# Patient Record
Sex: Female | Born: 1970
Health system: Southern US, Community
[De-identification: ages and names within clinical notes are randomized; demographics above are authoritative.]

## PROBLEM LIST (undated history)

## (undated) DIAGNOSIS — IMO0001 Reserved for inherently not codable concepts without codable children: Secondary | ICD-10-CM

## (undated) DIAGNOSIS — M1990 Unspecified osteoarthritis, unspecified site: Secondary | ICD-10-CM

## (undated) DIAGNOSIS — K219 Gastro-esophageal reflux disease without esophagitis: Secondary | ICD-10-CM

---

## 2010-07-02 ENCOUNTER — Ambulatory Visit: Payer: No Typology Code available for payment source | Attending: Family Medicine

## 2010-07-02 DIAGNOSIS — M542 Cervicalgia: Secondary | ICD-10-CM | POA: Insufficient documentation

## 2010-07-02 DIAGNOSIS — M545 Low back pain, unspecified: Secondary | ICD-10-CM | POA: Insufficient documentation

## 2010-07-02 DIAGNOSIS — R5381 Other malaise: Secondary | ICD-10-CM | POA: Insufficient documentation

## 2010-07-02 DIAGNOSIS — IMO0001 Reserved for inherently not codable concepts without codable children: Secondary | ICD-10-CM | POA: Insufficient documentation

## 2010-07-08 ENCOUNTER — Ambulatory Visit: Payer: BC Managed Care – PPO | Attending: Family Medicine

## 2010-07-08 DIAGNOSIS — M542 Cervicalgia: Secondary | ICD-10-CM | POA: Insufficient documentation

## 2010-07-08 DIAGNOSIS — M545 Low back pain, unspecified: Secondary | ICD-10-CM | POA: Insufficient documentation

## 2010-07-08 DIAGNOSIS — IMO0001 Reserved for inherently not codable concepts without codable children: Secondary | ICD-10-CM | POA: Insufficient documentation

## 2010-07-08 DIAGNOSIS — R5381 Other malaise: Secondary | ICD-10-CM | POA: Insufficient documentation

## 2010-07-09 ENCOUNTER — Ambulatory Visit: Payer: BC Managed Care – PPO

## 2010-07-14 ENCOUNTER — Ambulatory Visit: Payer: BC Managed Care – PPO

## 2010-07-16 ENCOUNTER — Ambulatory Visit: Payer: BC Managed Care – PPO

## 2010-07-21 ENCOUNTER — Ambulatory Visit: Payer: BC Managed Care – PPO

## 2010-07-23 ENCOUNTER — Ambulatory Visit: Payer: BC Managed Care – PPO

## 2010-07-28 ENCOUNTER — Ambulatory Visit: Payer: BC Managed Care – PPO

## 2010-07-30 ENCOUNTER — Ambulatory Visit: Payer: BC Managed Care – PPO

## 2010-08-04 ENCOUNTER — Ambulatory Visit: Payer: No Typology Code available for payment source | Attending: Family Medicine

## 2010-08-04 DIAGNOSIS — M542 Cervicalgia: Secondary | ICD-10-CM | POA: Insufficient documentation

## 2010-08-04 DIAGNOSIS — M545 Low back pain, unspecified: Secondary | ICD-10-CM | POA: Insufficient documentation

## 2010-08-04 DIAGNOSIS — IMO0001 Reserved for inherently not codable concepts without codable children: Secondary | ICD-10-CM | POA: Insufficient documentation

## 2010-08-04 DIAGNOSIS — R5381 Other malaise: Secondary | ICD-10-CM | POA: Insufficient documentation

## 2010-08-06 ENCOUNTER — Ambulatory Visit: Payer: No Typology Code available for payment source

## 2010-08-11 ENCOUNTER — Ambulatory Visit: Payer: No Typology Code available for payment source

## 2010-08-13 ENCOUNTER — Ambulatory Visit: Payer: No Typology Code available for payment source

## 2010-08-20 ENCOUNTER — Other Ambulatory Visit: Payer: Self-pay | Admitting: Sports Medicine

## 2010-08-20 ENCOUNTER — Ambulatory Visit
Admission: RE | Admit: 2010-08-20 | Discharge: 2010-08-20 | Disposition: A | Discharge status: Home / Self Care | Payer: No Typology Code available for payment source | Source: Ambulatory Visit | Attending: Sports Medicine | Admitting: Sports Medicine

## 2010-08-20 DIAGNOSIS — M25512 Pain in left shoulder: Secondary | ICD-10-CM

## 2010-12-23 ENCOUNTER — Other Ambulatory Visit (HOSPITAL_COMMUNITY)
Admission: RE | Admit: 2010-12-23 | Discharge: 2010-12-23 | Disposition: A | Discharge status: Home / Self Care | Payer: BC Managed Care – PPO | Source: Ambulatory Visit | Attending: Family Medicine | Admitting: Family Medicine

## 2010-12-23 DIAGNOSIS — Z124 Encounter for screening for malignant neoplasm of cervix: Secondary | ICD-10-CM | POA: Insufficient documentation

## 2010-12-23 DIAGNOSIS — Z1159 Encounter for screening for other viral diseases: Secondary | ICD-10-CM | POA: Insufficient documentation

## 2012-01-27 ENCOUNTER — Ambulatory Visit: Payer: BC Managed Care – PPO

## 2012-01-27 ENCOUNTER — Other Ambulatory Visit: Payer: Self-pay | Admitting: Sports Medicine

## 2012-01-27 DIAGNOSIS — M79642 Pain in left hand: Secondary | ICD-10-CM

## 2012-03-02 ENCOUNTER — Other Ambulatory Visit: Payer: Self-pay | Admitting: Sports Medicine

## 2012-03-02 ENCOUNTER — Ambulatory Visit (INDEPENDENT_AMBULATORY_CARE_PROVIDER_SITE_OTHER): Payer: BC Managed Care – PPO

## 2012-03-02 DIAGNOSIS — M79609 Pain in unspecified limb: Secondary | ICD-10-CM

## 2012-03-02 DIAGNOSIS — R52 Pain, unspecified: Secondary | ICD-10-CM

## 2012-03-30 ENCOUNTER — Other Ambulatory Visit: Payer: Self-pay | Admitting: Sports Medicine

## 2012-03-30 ENCOUNTER — Ambulatory Visit (INDEPENDENT_AMBULATORY_CARE_PROVIDER_SITE_OTHER): Payer: BC Managed Care – PPO

## 2012-03-30 DIAGNOSIS — R52 Pain, unspecified: Secondary | ICD-10-CM

## 2012-03-30 DIAGNOSIS — M79609 Pain in unspecified limb: Secondary | ICD-10-CM

## 2012-08-30 ENCOUNTER — Other Ambulatory Visit (HOSPITAL_COMMUNITY): Payer: Self-pay | Admitting: Family Medicine

## 2012-08-30 DIAGNOSIS — Z1231 Encounter for screening mammogram for malignant neoplasm of breast: Secondary | ICD-10-CM

## 2012-09-07 ENCOUNTER — Other Ambulatory Visit: Payer: Self-pay | Admitting: Family Medicine

## 2012-09-07 ENCOUNTER — Ambulatory Visit (HOSPITAL_COMMUNITY)
Admission: RE | Admit: 2012-09-07 | Discharge: 2012-09-07 | Disposition: A | Discharge status: Home / Self Care | Payer: BC Managed Care – PPO | Source: Ambulatory Visit | Attending: Family Medicine | Admitting: Family Medicine

## 2012-09-07 DIAGNOSIS — Z1231 Encounter for screening mammogram for malignant neoplasm of breast: Secondary | ICD-10-CM

## 2012-09-07 DIAGNOSIS — N63 Unspecified lump in unspecified breast: Secondary | ICD-10-CM

## 2012-09-28 ENCOUNTER — Ambulatory Visit
Admission: RE | Admit: 2012-09-28 | Discharge: 2012-09-28 | Disposition: A | Discharge status: Home / Self Care | Payer: BC Managed Care – PPO | Source: Ambulatory Visit | Attending: Family Medicine | Admitting: Family Medicine

## 2012-09-28 DIAGNOSIS — N63 Unspecified lump in unspecified breast: Secondary | ICD-10-CM

## 2013-01-27 ENCOUNTER — Other Ambulatory Visit (HOSPITAL_COMMUNITY)
Admission: RE | Admit: 2013-01-27 | Discharge: 2013-01-27 | Disposition: A | Discharge status: Home / Self Care | Payer: BC Managed Care – PPO | Source: Ambulatory Visit | Attending: Family Medicine | Admitting: Family Medicine

## 2013-01-27 ENCOUNTER — Other Ambulatory Visit: Payer: Self-pay | Admitting: Family Medicine

## 2013-01-27 DIAGNOSIS — Z124 Encounter for screening for malignant neoplasm of cervix: Secondary | ICD-10-CM | POA: Insufficient documentation

## 2013-01-28 ENCOUNTER — Encounter (HOSPITAL_COMMUNITY): Payer: Self-pay | Admitting: *Deleted

## 2013-01-28 ENCOUNTER — Emergency Department (HOSPITAL_COMMUNITY)
Admission: EM | Admit: 2013-01-28 | Discharge: 2013-01-28 | Disposition: A | Discharge status: Home / Self Care | Payer: BC Managed Care – PPO | Attending: Emergency Medicine | Admitting: Emergency Medicine

## 2013-01-28 ENCOUNTER — Emergency Department (HOSPITAL_COMMUNITY): Payer: BC Managed Care – PPO

## 2013-01-28 DIAGNOSIS — Z792 Long term (current) use of antibiotics: Secondary | ICD-10-CM | POA: Insufficient documentation

## 2013-01-28 DIAGNOSIS — S0003XA Contusion of scalp, initial encounter: Secondary | ICD-10-CM | POA: Insufficient documentation

## 2013-01-28 DIAGNOSIS — K219 Gastro-esophageal reflux disease without esophagitis: Secondary | ICD-10-CM | POA: Insufficient documentation

## 2013-01-28 DIAGNOSIS — S0083XA Contusion of other part of head, initial encounter: Secondary | ICD-10-CM

## 2013-01-28 DIAGNOSIS — Z79899 Other long term (current) drug therapy: Secondary | ICD-10-CM | POA: Insufficient documentation

## 2013-01-28 HISTORY — DX: Gastro-esophageal reflux disease without esophagitis: K21.9

## 2013-01-28 HISTORY — DX: Reserved for inherently not codable concepts without codable children: IMO0001
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
## 2013-01-30 ENCOUNTER — Other Ambulatory Visit: Payer: Self-pay | Admitting: Family Medicine

## 2013-01-30 DIAGNOSIS — R748 Abnormal levels of other serum enzymes: Secondary | ICD-10-CM

## 2013-02-03 ENCOUNTER — Ambulatory Visit
Admission: RE | Admit: 2013-02-03 | Discharge: 2013-02-03 | Disposition: A | Discharge status: Home / Self Care | Payer: BC Managed Care – PPO | Source: Ambulatory Visit | Attending: Family Medicine | Admitting: Family Medicine

## 2013-02-03 DIAGNOSIS — R748 Abnormal levels of other serum enzymes: Secondary | ICD-10-CM

## 2013-03-06 ENCOUNTER — Encounter (HOSPITAL_COMMUNITY): Payer: Self-pay | Admitting: Emergency Medicine

## 2013-03-06 ENCOUNTER — Emergency Department (HOSPITAL_COMMUNITY): Payer: BC Managed Care – PPO

## 2013-03-06 ENCOUNTER — Emergency Department (HOSPITAL_COMMUNITY)
Admission: EM | Admit: 2013-03-06 | Discharge: 2013-03-06 | Disposition: A | Discharge status: Home / Self Care | Payer: BC Managed Care – PPO | Attending: Emergency Medicine | Admitting: Emergency Medicine

## 2013-03-06 DIAGNOSIS — S4980XA Other specified injuries of shoulder and upper arm, unspecified arm, initial encounter: Secondary | ICD-10-CM | POA: Insufficient documentation

## 2013-03-06 DIAGNOSIS — S59909A Unspecified injury of unspecified elbow, initial encounter: Secondary | ICD-10-CM | POA: Insufficient documentation

## 2013-03-06 DIAGNOSIS — S0993XA Unspecified injury of face, initial encounter: Secondary | ICD-10-CM | POA: Insufficient documentation

## 2013-03-06 DIAGNOSIS — S79919A Unspecified injury of unspecified hip, initial encounter: Secondary | ICD-10-CM | POA: Insufficient documentation

## 2013-03-06 DIAGNOSIS — M2550 Pain in unspecified joint: Secondary | ICD-10-CM

## 2013-03-06 DIAGNOSIS — S79929A Unspecified injury of unspecified thigh, initial encounter: Secondary | ICD-10-CM | POA: Insufficient documentation

## 2013-03-06 DIAGNOSIS — S6990XA Unspecified injury of unspecified wrist, hand and finger(s), initial encounter: Secondary | ICD-10-CM | POA: Insufficient documentation

## 2013-03-06 DIAGNOSIS — S46909A Unspecified injury of unspecified muscle, fascia and tendon at shoulder and upper arm level, unspecified arm, initial encounter: Secondary | ICD-10-CM | POA: Insufficient documentation

## 2013-03-06 DIAGNOSIS — K219 Gastro-esophageal reflux disease without esophagitis: Secondary | ICD-10-CM | POA: Insufficient documentation

## 2013-03-06 HISTORY — DX: Unspecified osteoarthritis, unspecified site: M19.90

## 2013-03-06 MED ORDER — KETOROLAC TROMETHAMINE 60 MG/2ML IM SOLN
60.0000 mg | Freq: Once | INTRAMUSCULAR | Status: AC
  Administered 2013-03-06: 60 mg via INTRAMUSCULAR
  Filled 2013-03-06: qty 2

## 2013-03-06 MED ORDER — MELOXICAM 7.5 MG PO TABS: 15.0000 mg | ORAL_TABLET | Freq: Every day | ORAL | Status: DC
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
## 2013-03-07 NOTE — ED Provider Notes (Signed)
Medical screening examination/treatment/procedure(s) were performed by non-physician practitioner and as supervising physician I was immediately available for consultation/collaboration.  Toy Baker, MD 03/07/13 2325

## 2013-06-12 ENCOUNTER — Emergency Department (HOSPITAL_COMMUNITY): Payer: BC Managed Care – PPO

## 2013-06-12 ENCOUNTER — Emergency Department (HOSPITAL_COMMUNITY)
Admission: EM | Admit: 2013-06-12 | Discharge: 2013-06-13 | Disposition: A | Discharge status: Home / Self Care | Payer: BC Managed Care – PPO | Attending: Emergency Medicine | Admitting: Emergency Medicine

## 2013-06-12 ENCOUNTER — Encounter (HOSPITAL_COMMUNITY): Payer: Self-pay | Admitting: Emergency Medicine

## 2013-06-12 DIAGNOSIS — R0789 Other chest pain: Secondary | ICD-10-CM | POA: Insufficient documentation

## 2013-06-12 DIAGNOSIS — Z79899 Other long term (current) drug therapy: Secondary | ICD-10-CM | POA: Insufficient documentation

## 2013-06-12 DIAGNOSIS — K219 Gastro-esophageal reflux disease without esophagitis: Secondary | ICD-10-CM | POA: Insufficient documentation

## 2013-06-12 DIAGNOSIS — M129 Arthropathy, unspecified: Secondary | ICD-10-CM | POA: Insufficient documentation

## 2013-06-12 DIAGNOSIS — R079 Chest pain, unspecified: Secondary | ICD-10-CM

## 2013-06-12 LAB — POCT I-STAT TROPONIN I: TROPONIN I, POC: 0.01 ng/mL (ref 0.00–0.08)

## 2013-06-12 LAB — BASIC METABOLIC PANEL
BUN: 11 mg/dL (ref 6–23)
CALCIUM: 9.1 mg/dL (ref 8.4–10.5)
CHLORIDE: 100 meq/L (ref 96–112)
CO2: 27 meq/L (ref 19–32)
Creatinine, Ser: 0.68 mg/dL (ref 0.50–1.10)
GFR calc Af Amer: >90 mL/min (ref >90)
GFR calc non Af Amer: >90 mL/min (ref >90)
Glucose, Bld: 111 mg/dL — ABNORMAL HIGH (ref 70–99)
POTASSIUM: 4.2 meq/L (ref 3.7–5.3)
SODIUM: 138 meq/L (ref 137–147)

## 2013-06-12 LAB — CBC
HCT: 36.8 % (ref 36.0–46.0)
HEMOGLOBIN: 12.2 g/dL (ref 12.0–15.0)
MCH: 27.4 pg (ref 26.0–34.0)
MCHC: 33.2 g/dL (ref 30.0–36.0)
MCV: 82.5 fL (ref 78.0–100.0)
PLATELETS: 281 10*3/uL (ref 150–400)
RBC: 4.46 MIL/uL (ref 3.87–5.11)
RDW: 13.4 % (ref 11.5–15.5)
WBC: 11.6 10*3/uL — AB (ref 4.0–10.5)

## 2013-06-12 LAB — D-DIMER, QUANTITATIVE (NOT AT ARMC): D-Dimer, Quant: 0.39 ug/mL-FEU (ref 0.00–0.48)

## 2013-06-12 LAB — TROPONIN I: Troponin I: <0.3 ng/mL (ref <0.30)

## 2013-06-12 MED ORDER — IBUPROFEN 800 MG PO TABS
800.0000 mg | ORAL_TABLET | Freq: Once | ORAL | Status: AC
  Administered 2013-06-12: 800 mg via ORAL
  Filled 2013-06-12: qty 1

## 2013-06-12 NOTE — ED Notes (Signed)
Pt states that she began to have central chest pain described as burning that began around 2pm; pt states that she has had intermittent pain to her rt shoulder and left shoulder and some to her back; pt denies shortness of breath or nausea; pt c/o mild dizziness.

## 2013-06-12 NOTE — ED Provider Notes (Signed)
CSN: [id number]     Arrival date & time 06/12/13  1858 History   First MD Initiated Contact with Patient 06/12/13 2048     Chief Complaint  Patient presents with  . Chest Pain     (Consider location/radiation/quality/duration/timing/severity/associated sxs/prior Treatment) HPI Pt presenting with c/o central burning chest pain which started approx 2pm today. Pain has been constant since that time.  She was in a meeting when pain began.  No cough, no nausea or diaphoresis.  Pain was also in right shoulder.  No abdominal pain or vomiting.  No fever/chills, no leg swelling. No hx of recent trauma or surgery.  Did have a flight to [place] < 2 hours last week.  Chest pain began at rest and described as burning.  No hx of similar symptoms.  There are no other associated systemic symptoms, there are no other alleviating or modifying factors.   Past Medical History  Diagnosis Date  . Reflux   . Joint inflammation    History reviewed. No pertinent past surgical history. No family history on file. History  Substance Use Topics  . Smoking status: Never Smoker   . Smokeless tobacco: Not on file  . Alcohol Use: Yes     Comment: social   OB History   Grav Para Term Preterm Abortions TAB SAB Ect Mult Living                 Review of Systems ROS reviewed and all otherwise negative except for mentioned in HPI    Allergies  Flexeril  Home Medications   Current Outpatient Rx  Name  Route  Sig  Dispense  Refill  . esomeprazole (NEXIUM) 40 MG capsule   Oral   Take 40 mg by mouth every evening.         Marland Kitchen ibuprofen (ADVIL,MOTRIN) 200 MG tablet   Oral   Take 200 mg by mouth every 6 (six) hours as needed for mild pain.         . Multiple Vitamin (MULTIVITAMIN WITH MINERALS) TABS tablet   Oral   Take 1 tablet by mouth every morning.          BP 135/80  Pulse 86  Temp(Src) 98.8 F (37.1 C) (Oral)  Resp 18  SpO2 100%  LMP 06/05/2013 Vitals reviewed Physical Exam Physical  Examination: General appearance - alert, well appearing, and in no distress Mental status - alert, oriented to person, place, and time Eyes - no conjunctival injection, no scleral icterus Mouth - mucous membranes moist, pharynx normal without lesions Chest - clear to auscultation, no wheezes, rales or rhonchi, symmetric air entry Heart - normal rate, regular rhythm, normal S1, S2, no murmurs, rubs, clicks or gallops Abdomen - soft, nontender, nondistended, no masses or organomegaly Extremities - peripheral pulses normal, no pedal edema, no clubbing or cyanosis Skin - normal coloration and turgor, no rashes  ED Course  Procedures (including critical care time) Labs Review Labs Reviewed  CBC - Abnormal; Notable for the following:    WBC 11.6 (*)    All other components within normal limits  BASIC METABOLIC PANEL - Abnormal; Notable for the following:    Glucose, Bld 111 (*)    All other components within normal limits  TROPONIN I  D-DIMER, QUANTITATIVE  POCT I-STAT TROPONIN I   Imaging Review Dg Chest 2 View  06/12/2013   CLINICAL DATA:  Chest pain, cough  EXAM: CHEST  2 VIEW  COMPARISON:  None.  FINDINGS: The heart  size and mediastinal contours are within normal limits. Both lungs are clear. The visualized skeletal structures are unremarkable.  IMPRESSION: No active cardiopulmonary disease.   Electronically Signed   By: Salome HolmesHector  Cooper M.D.   On: 06/12/2013 20:42    EKG Interpretation    Date/Time:  Monday June 12 2013 19:10:59 EST Ventricular Rate:  60 PR Interval:  154 QRS Duration: 74 QT Interval:  408 QTC Calculation: 408 R Axis:   21 Text Interpretation:  Sinus rhythm nonspecific st changes No old tracing to compare Confirmed by [facility] Center[facility]  MD, MARTHA [phone]([id number]) on 06/13/2013 5:09:07 PM            MDM   Final diagnoses:  Chest pain    Pt presenting with c/o chest pain which has been constant since 2pm today.  2 sets of troponin negative, ekg reassuring, d-dimer  obtained due to recent travel, this was also negative.  Doubt ACS, Doubt PE.  Discharged with strict return precautions.  Pt agreeable with plan.    Ethelda ChickMartha K Linker, MD 06/13/13 [phone]

## 2013-06-12 NOTE — Progress Notes (Signed)
   CARE MANAGEMENT ED NOTE 06/12/2013  Patient:  Abigail Moore,Abigail Moore   Account Number:  [id number]  Date Initiated:  06/12/2013  Documentation initiated by:  Radford PaxFERRERO,Timisha Mondry  Subjective/Objective Assessment:   Patient presents to Ed with central chest pain     Subjective/Objective Assessment Detail:   Patient denies shortness of bresth and nausea.     Action/Plan:   Action/Plan Detail:   Anticipated DC Date:       Status Recommendation to Physician:   Result of Recommendation:    Other ED Services  Consult Working Plan    DC Planning Services  Other  PCP issues    Choice offered to / List presented to:            Status of service:  Completed, signed off  ED Comments:   ED Comments Detail:  Patient confirms her pcp is Dr. Paulino RilyWolters.  System updated

## 2013-06-13 NOTE — Discharge Instructions (Signed)
Return to the ED with any concerns including difficulty breathing, fever/chills, fainting, leg swelling, decreased level of alertness/lethargy, or any other alarming symptoms  Your EKG was reassuring, the labs including cardiac enzymes and screening test for blood clot was negative

## 2014-06-21 ENCOUNTER — Other Ambulatory Visit (HOSPITAL_COMMUNITY)
Admission: RE | Admit: 2014-06-21 | Discharge: 2014-06-21 | Disposition: A | Discharge status: Home / Self Care | Payer: BLUE CROSS/BLUE SHIELD | Source: Ambulatory Visit | Attending: Family Medicine | Admitting: Family Medicine

## 2014-06-21 ENCOUNTER — Other Ambulatory Visit: Payer: Self-pay | Admitting: Family Medicine

## 2014-06-21 DIAGNOSIS — Z113 Encounter for screening for infections with a predominantly sexual mode of transmission: Secondary | ICD-10-CM | POA: Insufficient documentation

## 2014-06-21 DIAGNOSIS — Z124 Encounter for screening for malignant neoplasm of cervix: Secondary | ICD-10-CM | POA: Diagnosis not present

## 2014-06-22 LAB — CYTOLOGY - PAP

## 2015-01-22 ENCOUNTER — Emergency Department (HOSPITAL_COMMUNITY)
Admission: EM | Admit: 2015-01-22 | Discharge: 2015-01-23 | Disposition: A | Discharge status: Home / Self Care | Payer: BLUE CROSS/BLUE SHIELD | Attending: Emergency Medicine | Admitting: Emergency Medicine

## 2015-01-22 ENCOUNTER — Encounter (HOSPITAL_COMMUNITY): Payer: Self-pay | Admitting: Nurse Practitioner

## 2015-01-22 DIAGNOSIS — Z79899 Other long term (current) drug therapy: Secondary | ICD-10-CM | POA: Insufficient documentation

## 2015-01-22 DIAGNOSIS — X58XXXA Exposure to other specified factors, initial encounter: Secondary | ICD-10-CM | POA: Diagnosis not present

## 2015-01-22 DIAGNOSIS — K219 Gastro-esophageal reflux disease without esophagitis: Secondary | ICD-10-CM | POA: Insufficient documentation

## 2015-01-22 DIAGNOSIS — M545 Low back pain, unspecified: Secondary | ICD-10-CM

## 2015-01-22 DIAGNOSIS — S39012A Strain of muscle, fascia and tendon of lower back, initial encounter: Secondary | ICD-10-CM | POA: Diagnosis not present

## 2015-01-22 DIAGNOSIS — Y9289 Other specified places as the place of occurrence of the external cause: Secondary | ICD-10-CM | POA: Diagnosis not present

## 2015-01-22 DIAGNOSIS — Y998 Other external cause status: Secondary | ICD-10-CM | POA: Insufficient documentation

## 2015-01-22 DIAGNOSIS — Y9389 Activity, other specified: Secondary | ICD-10-CM | POA: Insufficient documentation

## 2015-01-22 DIAGNOSIS — T148XXA Other injury of unspecified body region, initial encounter: Secondary | ICD-10-CM

## 2015-01-22 MED ORDER — HYDROCODONE-ACETAMINOPHEN 5-325 MG PO TABS: 1.0000 | ORAL_TABLET | ORAL | Status: AC | PRN

## 2015-01-22 NOTE — ED Provider Notes (Signed)
CSN: [id number]     Arrival date & time 01/22/15  2109 History  This chart was scribed for non-physician practitioner working with Eber Hong, MD, by Jarvis Morgan, ED Scribe. This patient was seen in room WTR5/WTR5 and the patient's care was started at 11:39 PM.     Chief Complaint  Patient presents with  . Sciatica  . Shoulder Pain   The history is provided by the patient. No language interpreter was used.   HPI Comments: Abigail Moore is a 44 y.o. female who presents to the Emergency Department complaining of constant, moderate, low back pain onset 3 days. She states the pain radiates down into her left hip and buttock. She reports that she has done heavy lifting lately which may have caused the pain. Pt endorses she saw her PCP, 2 days ago and was given rx for Tramadol and Zanaflex which is not providing relief. She states the pain is exacerbated by movement and ambulation. She denies any h/o back pain in the past. She denies any chest pain, abdominal pain, urinary/bowel incontinence, perianal numbness, numbness, weakness or tingling.   Past Medical History  Diagnosis Date  . Reflux   . Joint inflammation    History reviewed. No pertinent past surgical history. History reviewed. No pertinent family history. Social History  Substance Use Topics  . Smoking status: Never Smoker   . Smokeless tobacco: None  . Alcohol Use: Yes     Comment: social   OB History    No data available     Review of Systems  Cardiovascular: Negative for chest pain.  Gastrointestinal: Negative for abdominal pain and bowel incontinence.  Genitourinary: Negative for bladder incontinence.  Musculoskeletal: Positive for back pain.  Neurological: Negative for tingling, weakness and numbness.      Allergies  Flexeril  Home Medications   Prior to Admission medications   Medication Sig Start Date End Date Taking? Authorizing Provider  esomeprazole (NEXIUM) 40 MG capsule Take 40 mg by mouth every  evening.    Historical Provider, MD  ibuprofen (ADVIL,MOTRIN) 200 MG tablet Take 200 mg by mouth every 6 (six) hours as needed for mild pain.    Historical Provider, MD  Multiple Vitamin (MULTIVITAMIN WITH MINERALS) TABS tablet Take 1 tablet by mouth every morning.    Historical Provider, MD   BP 144/78 mmHg  Pulse 80  Temp(Src) 98.1 F (36.7 C) (Oral)  Resp 14  SpO2 100% Physical Exam  Constitutional: She is oriented to person, place, and time. She appears well-developed and well-nourished.  Neck: Normal range of motion.  Pulmonary/Chest: Effort normal.  Musculoskeletal:  Tender left greater than right lower back. Left sciatic tenderness. FROM LE's. Ambulatory. No midline spinal tenderness, including cervical spine. UE's have FROM with full strength. There is mild right trapezius tenderness. No swelling, redness.  Neurological: She is alert and oriented to person, place, and time.  Skin: Skin is warm and dry.    ED Course  Procedures (including critical care time) Labs Review Labs Reviewed - No data to display  Imaging Review No results found. I have personally reviewed and evaluated these images and lab results as part of my medical decision-making.   EKG Interpretation None      MDM   Final diagnoses:  None    1. Low back pain 2. Muscle strain  No neurologic deficits. She is currently taking Zanaflex and Tramadol. Will provide #12 Norco for uncontrolled pain, and encourage PCP follow up for further evaluation and management.  I personally performed the services described in this documentation, which was scribed in my presence. The recorded information has been reviewed and is accurate.   Elpidio Anis, PA-C 01/23/15 0272  Devoria Albe, MD 01/23/15 [phone]

## 2015-01-22 NOTE — ED Notes (Signed)
Pt is c/o back pain that she states is typical of her sciatic nerve pain. Reports taking her rx'd muscle pain with relief.

## 2015-01-22 NOTE — Discharge Instructions (Signed)
Heat Therapy °Heat therapy can help ease sore, stiff, injured, and tight muscles and joints. Heat relaxes your muscles, which may help ease your pain.  °RISKS AND COMPLICATIONS °If you have any of the following conditions, do not use heat therapy unless your health care provider has approved: °· Poor circulation. °· Healing wounds or scarred skin in the area being treated. °· Diabetes, heart disease, or high blood pressure. °· Not being able to feel (numbness) the area being treated. °· Unusual swelling of the area being treated. °· Active infections. °· Blood clots. °· Cancer. °· Inability to communicate pain. This may include young children and people who have problems with their brain function (dementia). °· Pregnancy. °Heat therapy should only be used on old, pre-existing, or long-lasting (chronic) injuries. Do not use heat therapy on new injuries unless directed by your health care provider. °HOW TO USE HEAT THERAPY °There are several different kinds of heat therapy, including: °· Moist heat pack. °· Warm water bath. °· Hot water bottle. °· Electric heating pad. °· Heated gel pack. °· Heated wrap. °· Electric heating pad. °Use the heat therapy method suggested by your health care provider. Follow your health care provider's instructions on when and how to use heat therapy. °GENERAL HEAT THERAPY RECOMMENDATIONS °· Do not sleep while using heat therapy. Only use heat therapy while you are awake. °· Your skin may turn pink while using heat therapy. Do not use heat therapy if your skin turns red. °· Do not use heat therapy if you have new pain. °· High heat or long exposure to heat can cause burns. Be careful when using heat therapy to avoid burning your skin. °· Do not use heat therapy on areas of your skin that are already irritated, such as with a rash or sunburn. °SEEK MEDICAL CARE IF: °· You have blisters, redness, swelling, or numbness. °· You have new pain. °· Your pain is worse. °MAKE SURE  YOU: °· Understand these instructions. °· Will watch your condition. °· Will get help right away if you are not doing well or get worse. °Document Released: 07/13/2011 Document Revised: 09/04/2013 Document Reviewed: 06/13/2013 °[employer]® Patient Information ©2015 [employer]. This information is not intended to replace advice given to you by your health care provider. Make sure you discuss any questions you have with your health care provider. ° °Muscle Strain °A muscle strain is an injury that occurs when a muscle is stretched beyond its normal length. Usually a small number of muscle fibers are torn when this happens. Muscle strain is rated in degrees. First-degree strains have the least amount of muscle fiber tearing and pain. Second-degree and third-degree strains have increasingly more tearing and pain.  °Usually, recovery from muscle strain takes 1-2 weeks. Complete healing takes 5-6 weeks.  °CAUSES  °Muscle strain happens when a sudden, violent force placed on a muscle stretches it too far. This may occur with lifting, sports, or a fall.  °RISK FACTORS °Muscle strain is especially common in athletes.  °SIGNS AND SYMPTOMS °At the site of the muscle strain, there may be: °· Pain. °· Bruising. °· Swelling. °· Difficulty using the muscle due to pain or lack of normal function. °DIAGNOSIS  °Your health care provider will perform a physical exam and ask about your medical history. °TREATMENT  °Often, the best treatment for a muscle strain is resting, icing, and applying cold compresses to the injured area.   °HOME CARE INSTRUCTIONS  °· Use the PRICE method of treatment to promote   muscle healing during the first 2-3 days after your injury. The PRICE method involves: °· Protecting the muscle from being injured again. °· Restricting your activity and resting the injured body part. °· Icing your injury. To do this, put ice in a plastic bag. Place a towel between your skin and the bag. Then, apply the ice and leave it  on from 15-20 minutes each hour. After the third day, switch to moist heat packs. °· Apply compression to the injured area with a splint or elastic bandage. Be careful not to wrap it too tightly. This may interfere with blood circulation or increase swelling. °· Elevate the injured body part above the level of your heart as often as you can. °· Only take over-the-counter or prescription medicines for pain, discomfort, or fever as directed by your health care provider. °· Warming up prior to exercise helps to prevent future muscle strains. °SEEK MEDICAL CARE IF:  °· You have increasing pain or swelling in the injured area. °· You have numbness, tingling, or a significant loss of strength in the injured area. °MAKE SURE YOU:  °· Understand these instructions. °· Will watch your condition. °· Will get help right away if you are not doing well or get worse. °Document Released: 04/20/2005 Document Revised: 02/08/2013 Document Reviewed: 11/17/2012 °[employer]® Patient Information ©2015 [employer]. This information is not intended to replace advice given to you by your health care provider. Make sure you discuss any questions you have with your health care provider. ° ° °
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
## 2015-08-06 DIAGNOSIS — M25672 Stiffness of left ankle, not elsewhere classified: Secondary | ICD-10-CM | POA: Diagnosis not present

## 2015-08-06 DIAGNOSIS — M25552 Pain in left hip: Secondary | ICD-10-CM | POA: Diagnosis not present

## 2015-08-06 DIAGNOSIS — M25572 Pain in left ankle and joints of left foot: Secondary | ICD-10-CM | POA: Diagnosis not present

## 2015-08-06 DIAGNOSIS — R262 Difficulty in walking, not elsewhere classified: Secondary | ICD-10-CM | POA: Diagnosis not present

## 2015-08-08 DIAGNOSIS — M5442 Lumbago with sciatica, left side: Secondary | ICD-10-CM | POA: Diagnosis not present

## 2015-08-08 DIAGNOSIS — M25552 Pain in left hip: Secondary | ICD-10-CM | POA: Diagnosis not present

## 2015-08-12 DIAGNOSIS — M6281 Muscle weakness (generalized): Secondary | ICD-10-CM | POA: Diagnosis not present

## 2015-08-12 DIAGNOSIS — M25672 Stiffness of left ankle, not elsewhere classified: Secondary | ICD-10-CM | POA: Diagnosis not present

## 2015-08-12 DIAGNOSIS — M25572 Pain in left ankle and joints of left foot: Secondary | ICD-10-CM | POA: Diagnosis not present

## 2015-08-12 DIAGNOSIS — M25552 Pain in left hip: Secondary | ICD-10-CM | POA: Diagnosis not present

## 2015-08-15 DIAGNOSIS — M25572 Pain in left ankle and joints of left foot: Secondary | ICD-10-CM | POA: Diagnosis not present

## 2015-08-15 DIAGNOSIS — M25672 Stiffness of left ankle, not elsewhere classified: Secondary | ICD-10-CM | POA: Diagnosis not present

## 2015-08-15 DIAGNOSIS — M25552 Pain in left hip: Secondary | ICD-10-CM | POA: Diagnosis not present

## 2015-08-15 DIAGNOSIS — M6281 Muscle weakness (generalized): Secondary | ICD-10-CM | POA: Diagnosis not present

## 2015-08-21 DIAGNOSIS — M25572 Pain in left ankle and joints of left foot: Secondary | ICD-10-CM | POA: Diagnosis not present

## 2015-08-21 DIAGNOSIS — R262 Difficulty in walking, not elsewhere classified: Secondary | ICD-10-CM | POA: Diagnosis not present

## 2015-08-21 DIAGNOSIS — M6281 Muscle weakness (generalized): Secondary | ICD-10-CM | POA: Diagnosis not present

## 2015-08-21 DIAGNOSIS — M25552 Pain in left hip: Secondary | ICD-10-CM | POA: Diagnosis not present

## 2015-08-22 DIAGNOSIS — M533 Sacrococcygeal disorders, not elsewhere classified: Secondary | ICD-10-CM | POA: Diagnosis not present

## 2015-08-23 DIAGNOSIS — M25672 Stiffness of left ankle, not elsewhere classified: Secondary | ICD-10-CM | POA: Diagnosis not present

## 2015-08-23 DIAGNOSIS — M25572 Pain in left ankle and joints of left foot: Secondary | ICD-10-CM | POA: Diagnosis not present

## 2015-08-23 DIAGNOSIS — M6281 Muscle weakness (generalized): Secondary | ICD-10-CM | POA: Diagnosis not present

## 2015-08-23 DIAGNOSIS — R262 Difficulty in walking, not elsewhere classified: Secondary | ICD-10-CM | POA: Diagnosis not present

## 2015-08-26 DIAGNOSIS — I10 Essential (primary) hypertension: Secondary | ICD-10-CM | POA: Diagnosis not present

## 2015-08-26 DIAGNOSIS — M7672 Peroneal tendinitis, left leg: Secondary | ICD-10-CM | POA: Diagnosis not present

## 2015-08-29 DIAGNOSIS — M25672 Stiffness of left ankle, not elsewhere classified: Secondary | ICD-10-CM | POA: Diagnosis not present

## 2015-08-29 DIAGNOSIS — M25552 Pain in left hip: Secondary | ICD-10-CM | POA: Diagnosis not present

## 2015-08-29 DIAGNOSIS — M6281 Muscle weakness (generalized): Secondary | ICD-10-CM | POA: Diagnosis not present

## 2015-08-29 DIAGNOSIS — M25572 Pain in left ankle and joints of left foot: Secondary | ICD-10-CM | POA: Diagnosis not present

## 2015-09-05 DIAGNOSIS — M25552 Pain in left hip: Secondary | ICD-10-CM | POA: Diagnosis not present

## 2015-09-05 DIAGNOSIS — R262 Difficulty in walking, not elsewhere classified: Secondary | ICD-10-CM | POA: Diagnosis not present

## 2015-09-05 DIAGNOSIS — M6281 Muscle weakness (generalized): Secondary | ICD-10-CM | POA: Diagnosis not present

## 2015-09-05 DIAGNOSIS — M25672 Stiffness of left ankle, not elsewhere classified: Secondary | ICD-10-CM | POA: Diagnosis not present

## 2015-09-13 DIAGNOSIS — M7672 Peroneal tendinitis, left leg: Secondary | ICD-10-CM | POA: Diagnosis not present

## 2015-09-13 DIAGNOSIS — M722 Plantar fascial fibromatosis: Secondary | ICD-10-CM | POA: Diagnosis not present

## 2015-10-16 DIAGNOSIS — I10 Essential (primary) hypertension: Secondary | ICD-10-CM | POA: Diagnosis not present

## 2015-10-16 DIAGNOSIS — R7303 Prediabetes: Secondary | ICD-10-CM | POA: Diagnosis not present

## 2015-10-16 DIAGNOSIS — Z0000 Encounter for general adult medical examination without abnormal findings: Secondary | ICD-10-CM | POA: Diagnosis not present

## 2015-10-16 DIAGNOSIS — Z79899 Other long term (current) drug therapy: Secondary | ICD-10-CM | POA: Diagnosis not present

## 2015-10-21 DIAGNOSIS — M958 Other specified acquired deformities of musculoskeletal system: Secondary | ICD-10-CM | POA: Diagnosis not present

## 2015-10-21 DIAGNOSIS — M25572 Pain in left ankle and joints of left foot: Secondary | ICD-10-CM | POA: Diagnosis not present

## 2015-10-21 DIAGNOSIS — M7672 Peroneal tendinitis, left leg: Secondary | ICD-10-CM | POA: Diagnosis not present

## 2015-10-28 DIAGNOSIS — I10 Essential (primary) hypertension: Secondary | ICD-10-CM | POA: Diagnosis not present

## 2015-10-28 DIAGNOSIS — K219 Gastro-esophageal reflux disease without esophagitis: Secondary | ICD-10-CM | POA: Diagnosis not present

## 2015-10-28 DIAGNOSIS — L83 Acanthosis nigricans: Secondary | ICD-10-CM | POA: Diagnosis not present

## 2015-10-28 DIAGNOSIS — M79641 Pain in right hand: Secondary | ICD-10-CM | POA: Diagnosis not present

## 2015-10-28 DIAGNOSIS — M25552 Pain in left hip: Secondary | ICD-10-CM | POA: Diagnosis not present

## 2015-10-28 DIAGNOSIS — M25572 Pain in left ankle and joints of left foot: Secondary | ICD-10-CM | POA: Diagnosis not present

## 2015-10-28 DIAGNOSIS — Z6841 Body Mass Index (BMI) 40.0 and over, adult: Secondary | ICD-10-CM | POA: Diagnosis not present

## 2015-10-28 DIAGNOSIS — M79642 Pain in left hand: Secondary | ICD-10-CM | POA: Diagnosis not present

## 2015-10-28 DIAGNOSIS — M25541 Pain in joints of right hand: Secondary | ICD-10-CM | POA: Diagnosis not present

## 2015-10-28 DIAGNOSIS — M25542 Pain in joints of left hand: Secondary | ICD-10-CM | POA: Diagnosis not present

## 2015-10-28 DIAGNOSIS — Z1589 Genetic susceptibility to other disease: Secondary | ICD-10-CM | POA: Diagnosis not present

## 2015-10-28 DIAGNOSIS — M545 Low back pain: Secondary | ICD-10-CM | POA: Diagnosis not present

## 2015-12-16 DIAGNOSIS — M958 Other specified acquired deformities of musculoskeletal system: Secondary | ICD-10-CM | POA: Diagnosis not present

## 2015-12-16 DIAGNOSIS — M7672 Peroneal tendinitis, left leg: Secondary | ICD-10-CM | POA: Diagnosis not present

## 2015-12-16 DIAGNOSIS — M25572 Pain in left ankle and joints of left foot: Secondary | ICD-10-CM | POA: Diagnosis not present

## 2016-01-08 DIAGNOSIS — K219 Gastro-esophageal reflux disease without esophagitis: Secondary | ICD-10-CM | POA: Diagnosis not present

## 2016-01-08 DIAGNOSIS — I10 Essential (primary) hypertension: Secondary | ICD-10-CM | POA: Diagnosis not present

## 2016-02-25 DIAGNOSIS — Z23 Encounter for immunization: Secondary | ICD-10-CM | POA: Diagnosis not present

## 2016-03-04 DIAGNOSIS — M25552 Pain in left hip: Secondary | ICD-10-CM | POA: Diagnosis not present

## 2016-03-25 DIAGNOSIS — M25552 Pain in left hip: Secondary | ICD-10-CM | POA: Diagnosis not present

## 2016-04-04 DIAGNOSIS — M25552 Pain in left hip: Secondary | ICD-10-CM | POA: Diagnosis not present

## 2016-04-10 DIAGNOSIS — M25552 Pain in left hip: Secondary | ICD-10-CM | POA: Diagnosis not present

## 2016-06-22 DIAGNOSIS — H52221 Regular astigmatism, right eye: Secondary | ICD-10-CM | POA: Diagnosis not present

## 2016-06-22 DIAGNOSIS — H524 Presbyopia: Secondary | ICD-10-CM | POA: Diagnosis not present

## 2016-06-22 DIAGNOSIS — H43811 Vitreous degeneration, right eye: Secondary | ICD-10-CM | POA: Diagnosis not present

## 2016-06-22 DIAGNOSIS — H5203 Hypermetropia, bilateral: Secondary | ICD-10-CM | POA: Diagnosis not present

## 2016-06-29 DIAGNOSIS — Z3009 Encounter for other general counseling and advice on contraception: Secondary | ICD-10-CM | POA: Diagnosis not present

## 2016-06-29 DIAGNOSIS — Z872 Personal history of diseases of the skin and subcutaneous tissue: Secondary | ICD-10-CM | POA: Diagnosis not present

## 2016-06-29 DIAGNOSIS — N644 Mastodynia: Secondary | ICD-10-CM | POA: Diagnosis not present

## 2016-07-01 DIAGNOSIS — M25552 Pain in left hip: Secondary | ICD-10-CM | POA: Diagnosis not present

## 2016-07-08 DIAGNOSIS — Z872 Personal history of diseases of the skin and subcutaneous tissue: Secondary | ICD-10-CM | POA: Diagnosis not present

## 2016-07-08 DIAGNOSIS — N644 Mastodynia: Secondary | ICD-10-CM | POA: Diagnosis not present

## 2016-07-08 DIAGNOSIS — N6320 Unspecified lump in the left breast, unspecified quadrant: Secondary | ICD-10-CM | POA: Diagnosis not present

## 2016-07-29 DIAGNOSIS — S3210XA Unspecified fracture of sacrum, initial encounter for closed fracture: Secondary | ICD-10-CM | POA: Diagnosis not present

## 2016-07-29 DIAGNOSIS — M545 Low back pain: Secondary | ICD-10-CM | POA: Diagnosis not present

## 2016-08-04 DIAGNOSIS — J0110 Acute frontal sinusitis, unspecified: Secondary | ICD-10-CM | POA: Diagnosis not present

## 2016-08-04 DIAGNOSIS — J029 Acute pharyngitis, unspecified: Secondary | ICD-10-CM | POA: Diagnosis not present

## 2016-08-14 DIAGNOSIS — M47897 Other spondylosis, lumbosacral region: Secondary | ICD-10-CM | POA: Diagnosis not present

## 2016-08-14 DIAGNOSIS — M5136 Other intervertebral disc degeneration, lumbar region: Secondary | ICD-10-CM | POA: Diagnosis not present

## 2016-08-14 DIAGNOSIS — M5137 Other intervertebral disc degeneration, lumbosacral region: Secondary | ICD-10-CM | POA: Diagnosis not present

## 2016-08-14 DIAGNOSIS — M47896 Other spondylosis, lumbar region: Secondary | ICD-10-CM | POA: Diagnosis not present

## 2016-08-14 DIAGNOSIS — M545 Low back pain: Secondary | ICD-10-CM | POA: Diagnosis not present

## 2016-08-20 DIAGNOSIS — M545 Low back pain: Secondary | ICD-10-CM | POA: Diagnosis not present

## 2016-08-20 DIAGNOSIS — M5416 Radiculopathy, lumbar region: Secondary | ICD-10-CM | POA: Diagnosis not present

## 2016-08-24 DIAGNOSIS — M5416 Radiculopathy, lumbar region: Secondary | ICD-10-CM | POA: Diagnosis not present

## 2016-10-08 DIAGNOSIS — M545 Low back pain: Secondary | ICD-10-CM | POA: Diagnosis not present

## 2016-12-23 DIAGNOSIS — S30824A Blister (nonthermal) of vagina and vulva, initial encounter: Secondary | ICD-10-CM | POA: Diagnosis not present

## 2016-12-23 DIAGNOSIS — Z113 Encounter for screening for infections with a predominantly sexual mode of transmission: Secondary | ICD-10-CM | POA: Diagnosis not present

## 2016-12-23 DIAGNOSIS — N898 Other specified noninflammatory disorders of vagina: Secondary | ICD-10-CM | POA: Diagnosis not present

## 2017-02-02 DIAGNOSIS — B354 Tinea corporis: Secondary | ICD-10-CM | POA: Diagnosis not present

## 2017-02-13 DIAGNOSIS — J329 Chronic sinusitis, unspecified: Secondary | ICD-10-CM | POA: Diagnosis not present

## 2017-03-24 DIAGNOSIS — E559 Vitamin D deficiency, unspecified: Secondary | ICD-10-CM | POA: Diagnosis not present

## 2017-03-24 DIAGNOSIS — I10 Essential (primary) hypertension: Secondary | ICD-10-CM | POA: Diagnosis not present

## 2017-03-24 DIAGNOSIS — R7303 Prediabetes: Secondary | ICD-10-CM | POA: Diagnosis not present

## 2017-03-24 DIAGNOSIS — Z0000 Encounter for general adult medical examination without abnormal findings: Secondary | ICD-10-CM | POA: Diagnosis not present

## 2017-06-02 DIAGNOSIS — N898 Other specified noninflammatory disorders of vagina: Secondary | ICD-10-CM | POA: Diagnosis not present

## 2017-06-23 DIAGNOSIS — R42 Dizziness and giddiness: Secondary | ICD-10-CM | POA: Diagnosis not present

## 2017-06-23 DIAGNOSIS — R110 Nausea: Secondary | ICD-10-CM | POA: Diagnosis not present

## 2017-06-23 DIAGNOSIS — R5382 Chronic fatigue, unspecified: Secondary | ICD-10-CM | POA: Diagnosis not present

## 2017-09-17 DIAGNOSIS — B356 Tinea cruris: Secondary | ICD-10-CM | POA: Diagnosis not present

## 2017-09-17 DIAGNOSIS — R079 Chest pain, unspecified: Secondary | ICD-10-CM | POA: Diagnosis not present

## 2017-09-17 DIAGNOSIS — I10 Essential (primary) hypertension: Secondary | ICD-10-CM | POA: Diagnosis not present

## 2017-10-22 DIAGNOSIS — K219 Gastro-esophageal reflux disease without esophagitis: Secondary | ICD-10-CM | POA: Diagnosis not present

## 2017-10-22 DIAGNOSIS — R21 Rash and other nonspecific skin eruption: Secondary | ICD-10-CM | POA: Diagnosis not present

## 2018-01-06 DIAGNOSIS — H43811 Vitreous degeneration, right eye: Secondary | ICD-10-CM | POA: Diagnosis not present

## 2018-01-06 DIAGNOSIS — H209 Unspecified iridocyclitis: Secondary | ICD-10-CM | POA: Diagnosis not present

## 2018-01-06 DIAGNOSIS — H04123 Dry eye syndrome of bilateral lacrimal glands: Secondary | ICD-10-CM | POA: Diagnosis not present

## 2018-01-06 DIAGNOSIS — H538 Other visual disturbances: Secondary | ICD-10-CM | POA: Diagnosis not present

## 2018-01-11 DIAGNOSIS — H524 Presbyopia: Secondary | ICD-10-CM | POA: Diagnosis not present

## 2018-01-11 DIAGNOSIS — H04123 Dry eye syndrome of bilateral lacrimal glands: Secondary | ICD-10-CM | POA: Diagnosis not present

## 2018-01-11 DIAGNOSIS — H2000 Unspecified acute and subacute iridocyclitis: Secondary | ICD-10-CM | POA: Diagnosis not present

## 2018-01-20 DIAGNOSIS — H524 Presbyopia: Secondary | ICD-10-CM | POA: Diagnosis not present

## 2018-01-20 DIAGNOSIS — H2000 Unspecified acute and subacute iridocyclitis: Secondary | ICD-10-CM | POA: Diagnosis not present

## 2018-01-20 DIAGNOSIS — H04123 Dry eye syndrome of bilateral lacrimal glands: Secondary | ICD-10-CM | POA: Diagnosis not present

## 2018-02-07 DIAGNOSIS — R0689 Other abnormalities of breathing: Secondary | ICD-10-CM | POA: Diagnosis not present

## 2018-02-07 DIAGNOSIS — J329 Chronic sinusitis, unspecified: Secondary | ICD-10-CM | POA: Diagnosis not present

## 2018-02-07 DIAGNOSIS — J40 Bronchitis, not specified as acute or chronic: Secondary | ICD-10-CM | POA: Diagnosis not present

## 2018-02-21 DIAGNOSIS — H2000 Unspecified acute and subacute iridocyclitis: Secondary | ICD-10-CM | POA: Diagnosis not present

## 2018-02-21 DIAGNOSIS — H04123 Dry eye syndrome of bilateral lacrimal glands: Secondary | ICD-10-CM | POA: Diagnosis not present

## 2018-02-21 DIAGNOSIS — H524 Presbyopia: Secondary | ICD-10-CM | POA: Diagnosis not present

## 2018-02-28 DIAGNOSIS — H2000 Unspecified acute and subacute iridocyclitis: Secondary | ICD-10-CM | POA: Diagnosis not present

## 2018-02-28 DIAGNOSIS — H524 Presbyopia: Secondary | ICD-10-CM | POA: Diagnosis not present

## 2018-02-28 DIAGNOSIS — H04123 Dry eye syndrome of bilateral lacrimal glands: Secondary | ICD-10-CM | POA: Diagnosis not present

## 2018-03-07 DIAGNOSIS — H04123 Dry eye syndrome of bilateral lacrimal glands: Secondary | ICD-10-CM | POA: Diagnosis not present

## 2018-03-07 DIAGNOSIS — H2000 Unspecified acute and subacute iridocyclitis: Secondary | ICD-10-CM | POA: Diagnosis not present

## 2018-03-07 DIAGNOSIS — H524 Presbyopia: Secondary | ICD-10-CM | POA: Diagnosis not present

## 2018-03-15 DIAGNOSIS — H2000 Unspecified acute and subacute iridocyclitis: Secondary | ICD-10-CM | POA: Diagnosis not present

## 2018-03-15 DIAGNOSIS — L906 Striae atrophicae: Secondary | ICD-10-CM | POA: Diagnosis not present

## 2018-03-15 DIAGNOSIS — H524 Presbyopia: Secondary | ICD-10-CM | POA: Diagnosis not present

## 2018-03-15 DIAGNOSIS — L810 Postinflammatory hyperpigmentation: Secondary | ICD-10-CM | POA: Diagnosis not present

## 2018-03-15 DIAGNOSIS — L089 Local infection of the skin and subcutaneous tissue, unspecified: Secondary | ICD-10-CM | POA: Diagnosis not present

## 2018-03-15 DIAGNOSIS — H04123 Dry eye syndrome of bilateral lacrimal glands: Secondary | ICD-10-CM | POA: Diagnosis not present

## 2018-03-27 DIAGNOSIS — H209 Unspecified iridocyclitis: Secondary | ICD-10-CM | POA: Diagnosis not present

## 2018-03-27 DIAGNOSIS — R42 Dizziness and giddiness: Secondary | ICD-10-CM | POA: Diagnosis not present

## 2018-03-30 ENCOUNTER — Other Ambulatory Visit (HOSPITAL_COMMUNITY)
Admission: RE | Admit: 2018-03-30 | Discharge: 2018-03-30 | Disposition: A | Discharge status: Home / Self Care | Payer: BLUE CROSS/BLUE SHIELD | Source: Ambulatory Visit | Attending: Family Medicine | Admitting: Family Medicine

## 2018-03-30 DIAGNOSIS — R7303 Prediabetes: Secondary | ICD-10-CM | POA: Diagnosis not present

## 2018-03-30 DIAGNOSIS — H209 Unspecified iridocyclitis: Secondary | ICD-10-CM | POA: Diagnosis not present

## 2018-03-30 DIAGNOSIS — Z23 Encounter for immunization: Secondary | ICD-10-CM | POA: Diagnosis not present

## 2018-03-30 DIAGNOSIS — Z0000 Encounter for general adult medical examination without abnormal findings: Secondary | ICD-10-CM | POA: Diagnosis not present

## 2018-03-30 DIAGNOSIS — Z01411 Encounter for gynecological examination (general) (routine) with abnormal findings: Secondary | ICD-10-CM | POA: Insufficient documentation

## 2018-03-30 DIAGNOSIS — Z79899 Other long term (current) drug therapy: Secondary | ICD-10-CM | POA: Diagnosis not present

## 2018-03-30 DIAGNOSIS — N3943 Post-void dribbling: Secondary | ICD-10-CM | POA: Diagnosis not present

## 2018-04-04 ENCOUNTER — Other Ambulatory Visit: Payer: Self-pay | Admitting: Family Medicine

## 2018-04-05 DIAGNOSIS — H04123 Dry eye syndrome of bilateral lacrimal glands: Secondary | ICD-10-CM | POA: Diagnosis not present

## 2018-04-05 DIAGNOSIS — H43393 Other vitreous opacities, bilateral: Secondary | ICD-10-CM | POA: Diagnosis not present

## 2018-04-05 DIAGNOSIS — H2000 Unspecified acute and subacute iridocyclitis: Secondary | ICD-10-CM | POA: Diagnosis not present

## 2018-04-07 LAB — CYTOLOGY - PAP
Diagnosis: NEGATIVE
HPV (WINDOPATH): NOT DETECTED

## 2018-04-18 DIAGNOSIS — H04123 Dry eye syndrome of bilateral lacrimal glands: Secondary | ICD-10-CM | POA: Diagnosis not present

## 2018-04-18 DIAGNOSIS — R7303 Prediabetes: Secondary | ICD-10-CM | POA: Diagnosis not present

## 2018-04-18 DIAGNOSIS — H43393 Other vitreous opacities, bilateral: Secondary | ICD-10-CM | POA: Diagnosis not present

## 2018-04-18 DIAGNOSIS — H2000 Unspecified acute and subacute iridocyclitis: Secondary | ICD-10-CM | POA: Diagnosis not present

## 2018-04-18 DIAGNOSIS — I10 Essential (primary) hypertension: Secondary | ICD-10-CM | POA: Diagnosis not present

## 2018-04-18 DIAGNOSIS — H0100A Unspecified blepharitis right eye, upper and lower eyelids: Secondary | ICD-10-CM | POA: Diagnosis not present

## 2018-05-12 DIAGNOSIS — H04123 Dry eye syndrome of bilateral lacrimal glands: Secondary | ICD-10-CM | POA: Diagnosis not present

## 2018-05-12 DIAGNOSIS — H43393 Other vitreous opacities, bilateral: Secondary | ICD-10-CM | POA: Diagnosis not present

## 2018-05-12 DIAGNOSIS — H0100A Unspecified blepharitis right eye, upper and lower eyelids: Secondary | ICD-10-CM | POA: Diagnosis not present

## 2018-05-12 DIAGNOSIS — H2000 Unspecified acute and subacute iridocyclitis: Secondary | ICD-10-CM | POA: Diagnosis not present

## 2018-06-08 DIAGNOSIS — Z1231 Encounter for screening mammogram for malignant neoplasm of breast: Secondary | ICD-10-CM | POA: Diagnosis not present

## 2018-06-10 DIAGNOSIS — H01003 Unspecified blepharitis right eye, unspecified eyelid: Secondary | ICD-10-CM | POA: Diagnosis not present

## 2018-06-10 DIAGNOSIS — H04123 Dry eye syndrome of bilateral lacrimal glands: Secondary | ICD-10-CM | POA: Diagnosis not present

## 2018-06-10 DIAGNOSIS — H2000 Unspecified acute and subacute iridocyclitis: Secondary | ICD-10-CM | POA: Diagnosis not present

## 2018-06-10 DIAGNOSIS — H43393 Other vitreous opacities, bilateral: Secondary | ICD-10-CM | POA: Diagnosis not present

## 2018-06-15 DIAGNOSIS — R922 Inconclusive mammogram: Secondary | ICD-10-CM | POA: Diagnosis not present

## 2018-06-15 DIAGNOSIS — R928 Other abnormal and inconclusive findings on diagnostic imaging of breast: Secondary | ICD-10-CM | POA: Diagnosis not present

## 2018-07-25 DIAGNOSIS — H04123 Dry eye syndrome of bilateral lacrimal glands: Secondary | ICD-10-CM | POA: Diagnosis not present

## 2018-07-25 DIAGNOSIS — H43393 Other vitreous opacities, bilateral: Secondary | ICD-10-CM | POA: Diagnosis not present

## 2018-07-25 DIAGNOSIS — H2000 Unspecified acute and subacute iridocyclitis: Secondary | ICD-10-CM | POA: Diagnosis not present

## 2018-07-25 DIAGNOSIS — H524 Presbyopia: Secondary | ICD-10-CM | POA: Diagnosis not present

## 2018-09-23 DIAGNOSIS — H2000 Unspecified acute and subacute iridocyclitis: Secondary | ICD-10-CM | POA: Diagnosis not present

## 2018-09-23 DIAGNOSIS — H43393 Other vitreous opacities, bilateral: Secondary | ICD-10-CM | POA: Diagnosis not present

## 2018-09-23 DIAGNOSIS — H04123 Dry eye syndrome of bilateral lacrimal glands: Secondary | ICD-10-CM | POA: Diagnosis not present

## 2018-09-23 DIAGNOSIS — H0100A Unspecified blepharitis right eye, upper and lower eyelids: Secondary | ICD-10-CM | POA: Diagnosis not present

## 2018-10-20 DIAGNOSIS — M79642 Pain in left hand: Secondary | ICD-10-CM | POA: Diagnosis not present

## 2018-10-20 DIAGNOSIS — J029 Acute pharyngitis, unspecified: Secondary | ICD-10-CM | POA: Diagnosis not present

## 2018-12-01 DIAGNOSIS — M545 Low back pain: Secondary | ICD-10-CM | POA: Diagnosis not present

## 2018-12-03 DIAGNOSIS — M545 Low back pain: Secondary | ICD-10-CM | POA: Diagnosis not present

## 2018-12-07 DIAGNOSIS — M545 Low back pain: Secondary | ICD-10-CM | POA: Diagnosis not present

## 2019-01-04 DIAGNOSIS — M545 Low back pain: Secondary | ICD-10-CM | POA: Diagnosis not present

## 2019-01-18 DIAGNOSIS — M5416 Radiculopathy, lumbar region: Secondary | ICD-10-CM | POA: Diagnosis not present

## 2019-01-19 DIAGNOSIS — R413 Other amnesia: Secondary | ICD-10-CM | POA: Diagnosis not present

## 2019-01-19 DIAGNOSIS — F419 Anxiety disorder, unspecified: Secondary | ICD-10-CM | POA: Diagnosis not present

## 2019-01-19 DIAGNOSIS — K219 Gastro-esophageal reflux disease without esophagitis: Secondary | ICD-10-CM | POA: Diagnosis not present

## 2019-01-19 DIAGNOSIS — Z23 Encounter for immunization: Secondary | ICD-10-CM | POA: Diagnosis not present

## 2019-03-23 DIAGNOSIS — R0981 Nasal congestion: Secondary | ICD-10-CM | POA: Diagnosis not present

## 2019-03-23 DIAGNOSIS — M549 Dorsalgia, unspecified: Secondary | ICD-10-CM | POA: Diagnosis not present

## 2019-03-23 DIAGNOSIS — J019 Acute sinusitis, unspecified: Secondary | ICD-10-CM | POA: Diagnosis not present

## 2019-03-23 DIAGNOSIS — Z03818 Encounter for observation for suspected exposure to other biological agents ruled out: Secondary | ICD-10-CM | POA: Diagnosis not present

## 2019-03-23 DIAGNOSIS — R519 Headache, unspecified: Secondary | ICD-10-CM | POA: Diagnosis not present

## 2019-03-23 DIAGNOSIS — F419 Anxiety disorder, unspecified: Secondary | ICD-10-CM | POA: Diagnosis not present

## 2019-03-23 DIAGNOSIS — J029 Acute pharyngitis, unspecified: Secondary | ICD-10-CM | POA: Diagnosis not present

## 2019-03-23 DIAGNOSIS — K219 Gastro-esophageal reflux disease without esophagitis: Secondary | ICD-10-CM | POA: Diagnosis not present

## 2019-03-29 DIAGNOSIS — F4310 Post-traumatic stress disorder, unspecified: Secondary | ICD-10-CM | POA: Diagnosis not present

## 2019-04-05 DIAGNOSIS — F4310 Post-traumatic stress disorder, unspecified: Secondary | ICD-10-CM | POA: Diagnosis not present

## 2019-04-12 DIAGNOSIS — F4310 Post-traumatic stress disorder, unspecified: Secondary | ICD-10-CM | POA: Diagnosis not present

## 2019-04-18 DIAGNOSIS — H0100A Unspecified blepharitis right eye, upper and lower eyelids: Secondary | ICD-10-CM | POA: Diagnosis not present

## 2019-04-18 DIAGNOSIS — H2000 Unspecified acute and subacute iridocyclitis: Secondary | ICD-10-CM | POA: Diagnosis not present

## 2019-04-18 DIAGNOSIS — H43393 Other vitreous opacities, bilateral: Secondary | ICD-10-CM | POA: Diagnosis not present

## 2019-04-18 DIAGNOSIS — H04123 Dry eye syndrome of bilateral lacrimal glands: Secondary | ICD-10-CM | POA: Diagnosis not present

## 2019-04-19 DIAGNOSIS — F4310 Post-traumatic stress disorder, unspecified: Secondary | ICD-10-CM | POA: Diagnosis not present

## 2019-04-26 DIAGNOSIS — F4310 Post-traumatic stress disorder, unspecified: Secondary | ICD-10-CM | POA: Diagnosis not present

## 2019-05-01 DIAGNOSIS — Z03818 Encounter for observation for suspected exposure to other biological agents ruled out: Secondary | ICD-10-CM | POA: Diagnosis not present

## 2019-05-01 DIAGNOSIS — R5081 Fever presenting with conditions classified elsewhere: Secondary | ICD-10-CM | POA: Diagnosis not present

## 2019-05-01 DIAGNOSIS — A0839 Other viral enteritis: Secondary | ICD-10-CM | POA: Diagnosis not present

## 2019-05-01 DIAGNOSIS — U071 COVID-19: Secondary | ICD-10-CM | POA: Diagnosis not present

## 2019-05-01 DIAGNOSIS — R197 Diarrhea, unspecified: Secondary | ICD-10-CM | POA: Diagnosis not present

## 2019-05-01 DIAGNOSIS — R05 Cough: Secondary | ICD-10-CM | POA: Diagnosis not present

## 2019-05-10 DIAGNOSIS — F4310 Post-traumatic stress disorder, unspecified: Secondary | ICD-10-CM | POA: Diagnosis not present

## 2019-05-17 DIAGNOSIS — F4310 Post-traumatic stress disorder, unspecified: Secondary | ICD-10-CM | POA: Diagnosis not present

## 2019-07-29 ENCOUNTER — Ambulatory Visit: Payer: BLUE CROSS/BLUE SHIELD | Attending: Internal Medicine

## 2019-07-29 DIAGNOSIS — Z23 Encounter for immunization: Secondary | ICD-10-CM

## 2019-07-29 NOTE — Progress Notes (Signed)
   Covid-19 Vaccination Clinic  Name:  Abigail Moore    MRN: [id number] DOB: 1970/10/03  07/29/2019  [name] was observed post Covid-19 immunization for 15 minutes without incident. She was provided with Vaccine Information Sheet and instruction to access the V-Safe system.   [name] was instructed to call 911 with any severe reactions post vaccine: Marland Kitchen Difficulty breathing  . Swelling of face and throat  . A fast heartbeat  . A bad rash all over body  . Dizziness and weakness   Immunizations Administered    Name Date Dose VIS Date Route   Pfizer COVID-19 Vaccine 07/29/2019  3:09 PM 0.3 mL 04/14/2019 Intramuscular   Manufacturer: [employer], [employer]   Lot: [id number]   NDC: [address]-3

## 2019-08-22 ENCOUNTER — Ambulatory Visit: Payer: Self-pay | Attending: Internal Medicine

## 2019-08-22 DIAGNOSIS — Z23 Encounter for immunization: Secondary | ICD-10-CM

## 2019-08-22 NOTE — Progress Notes (Signed)
   Covid-19 Vaccination Clinic  Name:  Abigail Moore    MRN: [id number] DOB: 06/12/1970  08/22/2019  [name] was observed post Covid-19 immunization for 15 minutes without incident. She was provided with Vaccine Information Sheet and instruction to access the V-Safe system.   [name] was instructed to call 911 with any severe reactions post vaccine: Marland Kitchen Difficulty breathing  . Swelling of face and throat  . A fast heartbeat  . A bad rash all over body  . Dizziness and weakness   Immunizations Administered    Name Date Dose VIS Date Route   Pfizer COVID-19 Vaccine 08/22/2019 11:15 AM 0.3 mL 06/28/2018 Intramuscular   Manufacturer: [employer], [employer]   Lot: [id number]   NDC: [address]-2

## 2020-01-03 DIAGNOSIS — F4310 Post-traumatic stress disorder, unspecified: Secondary | ICD-10-CM | POA: Diagnosis not present

## 2020-01-23 DIAGNOSIS — F4310 Post-traumatic stress disorder, unspecified: Secondary | ICD-10-CM | POA: Diagnosis not present

## 2020-01-26 DIAGNOSIS — H04123 Dry eye syndrome of bilateral lacrimal glands: Secondary | ICD-10-CM | POA: Diagnosis not present

## 2020-01-26 DIAGNOSIS — H2000 Unspecified acute and subacute iridocyclitis: Secondary | ICD-10-CM | POA: Diagnosis not present

## 2020-01-26 DIAGNOSIS — H0100B Unspecified blepharitis left eye, upper and lower eyelids: Secondary | ICD-10-CM | POA: Diagnosis not present

## 2020-01-26 DIAGNOSIS — H0100A Unspecified blepharitis right eye, upper and lower eyelids: Secondary | ICD-10-CM | POA: Diagnosis not present

## 2020-02-05 DIAGNOSIS — N898 Other specified noninflammatory disorders of vagina: Secondary | ICD-10-CM | POA: Diagnosis not present

## 2020-02-05 DIAGNOSIS — N761 Subacute and chronic vaginitis: Secondary | ICD-10-CM | POA: Diagnosis not present

## 2020-02-13 DIAGNOSIS — F4310 Post-traumatic stress disorder, unspecified: Secondary | ICD-10-CM | POA: Diagnosis not present

## 2020-02-15 DIAGNOSIS — Z713 Dietary counseling and surveillance: Secondary | ICD-10-CM | POA: Diagnosis not present

## 2020-02-22 DIAGNOSIS — H0100B Unspecified blepharitis left eye, upper and lower eyelids: Secondary | ICD-10-CM | POA: Diagnosis not present

## 2020-02-22 DIAGNOSIS — H43393 Other vitreous opacities, bilateral: Secondary | ICD-10-CM | POA: Diagnosis not present

## 2020-02-22 DIAGNOSIS — H04123 Dry eye syndrome of bilateral lacrimal glands: Secondary | ICD-10-CM | POA: Diagnosis not present

## 2020-02-22 DIAGNOSIS — H0100A Unspecified blepharitis right eye, upper and lower eyelids: Secondary | ICD-10-CM | POA: Diagnosis not present

## 2020-02-28 DIAGNOSIS — F4310 Post-traumatic stress disorder, unspecified: Secondary | ICD-10-CM | POA: Diagnosis not present

## 2020-03-06 DIAGNOSIS — R0681 Apnea, not elsewhere classified: Secondary | ICD-10-CM | POA: Diagnosis not present

## 2020-03-06 DIAGNOSIS — R5383 Other fatigue: Secondary | ICD-10-CM | POA: Diagnosis not present

## 2020-03-06 DIAGNOSIS — F419 Anxiety disorder, unspecified: Secondary | ICD-10-CM | POA: Diagnosis not present

## 2020-03-14 DIAGNOSIS — F4310 Post-traumatic stress disorder, unspecified: Secondary | ICD-10-CM | POA: Diagnosis not present

## 2020-04-10 DIAGNOSIS — F4310 Post-traumatic stress disorder, unspecified: Secondary | ICD-10-CM | POA: Diagnosis not present

## 2020-04-24 DIAGNOSIS — F4310 Post-traumatic stress disorder, unspecified: Secondary | ICD-10-CM | POA: Diagnosis not present

## 2020-05-15 DIAGNOSIS — F4310 Post-traumatic stress disorder, unspecified: Secondary | ICD-10-CM | POA: Diagnosis not present

## 2020-06-04 DIAGNOSIS — F4310 Post-traumatic stress disorder, unspecified: Secondary | ICD-10-CM | POA: Diagnosis not present

## 2020-06-25 DIAGNOSIS — F4310 Post-traumatic stress disorder, unspecified: Secondary | ICD-10-CM | POA: Diagnosis not present

## 2020-07-16 DIAGNOSIS — F4310 Post-traumatic stress disorder, unspecified: Secondary | ICD-10-CM | POA: Diagnosis not present

## 2020-07-29 DIAGNOSIS — Z6841 Body Mass Index (BMI) 40.0 and over, adult: Secondary | ICD-10-CM | POA: Diagnosis not present

## 2020-07-29 DIAGNOSIS — F411 Generalized anxiety disorder: Secondary | ICD-10-CM | POA: Diagnosis not present

## 2020-07-29 DIAGNOSIS — R739 Hyperglycemia, unspecified: Secondary | ICD-10-CM | POA: Diagnosis not present

## 2020-07-29 DIAGNOSIS — I10 Essential (primary) hypertension: Secondary | ICD-10-CM | POA: Diagnosis not present

## 2020-07-29 DIAGNOSIS — R5383 Other fatigue: Secondary | ICD-10-CM | POA: Diagnosis not present

## 2020-07-30 DIAGNOSIS — F4310 Post-traumatic stress disorder, unspecified: Secondary | ICD-10-CM | POA: Diagnosis not present

## 2020-08-13 DIAGNOSIS — F4310 Post-traumatic stress disorder, unspecified: Secondary | ICD-10-CM | POA: Diagnosis not present

## 2020-08-27 DIAGNOSIS — F4310 Post-traumatic stress disorder, unspecified: Secondary | ICD-10-CM | POA: Diagnosis not present

## 2020-09-17 DIAGNOSIS — F4310 Post-traumatic stress disorder, unspecified: Secondary | ICD-10-CM | POA: Diagnosis not present

## 2020-09-24 DIAGNOSIS — R739 Hyperglycemia, unspecified: Secondary | ICD-10-CM | POA: Diagnosis not present

## 2020-09-24 DIAGNOSIS — R197 Diarrhea, unspecified: Secondary | ICD-10-CM | POA: Diagnosis not present

## 2020-09-24 DIAGNOSIS — R110 Nausea: Secondary | ICD-10-CM | POA: Diagnosis not present

## 2020-09-24 DIAGNOSIS — Z1211 Encounter for screening for malignant neoplasm of colon: Secondary | ICD-10-CM | POA: Diagnosis not present

## 2020-10-14 DIAGNOSIS — F4310 Post-traumatic stress disorder, unspecified: Secondary | ICD-10-CM | POA: Diagnosis not present

## 2020-10-28 DIAGNOSIS — F4310 Post-traumatic stress disorder, unspecified: Secondary | ICD-10-CM | POA: Diagnosis not present

## 2020-10-29 DIAGNOSIS — E559 Vitamin D deficiency, unspecified: Secondary | ICD-10-CM | POA: Diagnosis not present

## 2020-10-29 DIAGNOSIS — Z0000 Encounter for general adult medical examination without abnormal findings: Secondary | ICD-10-CM | POA: Diagnosis not present

## 2020-10-29 DIAGNOSIS — R7303 Prediabetes: Secondary | ICD-10-CM | POA: Diagnosis not present

## 2020-10-29 DIAGNOSIS — H00015 Hordeolum externum left lower eyelid: Secondary | ICD-10-CM | POA: Diagnosis not present

## 2020-11-11 DIAGNOSIS — F4310 Post-traumatic stress disorder, unspecified: Secondary | ICD-10-CM | POA: Diagnosis not present

## 2020-11-25 DIAGNOSIS — F4310 Post-traumatic stress disorder, unspecified: Secondary | ICD-10-CM | POA: Diagnosis not present

## 2020-11-27 DIAGNOSIS — H209 Unspecified iridocyclitis: Secondary | ICD-10-CM | POA: Diagnosis not present

## 2020-11-27 DIAGNOSIS — Z1589 Genetic susceptibility to other disease: Secondary | ICD-10-CM | POA: Diagnosis not present

## 2020-11-27 DIAGNOSIS — H04123 Dry eye syndrome of bilateral lacrimal glands: Secondary | ICD-10-CM | POA: Diagnosis not present

## 2020-12-16 DIAGNOSIS — F4310 Post-traumatic stress disorder, unspecified: Secondary | ICD-10-CM | POA: Diagnosis not present

## 2020-12-30 DIAGNOSIS — F4310 Post-traumatic stress disorder, unspecified: Secondary | ICD-10-CM | POA: Diagnosis not present

## 2021-01-08 DIAGNOSIS — F4310 Post-traumatic stress disorder, unspecified: Secondary | ICD-10-CM | POA: Diagnosis not present

## 2021-01-15 DIAGNOSIS — G4733 Obstructive sleep apnea (adult) (pediatric): Secondary | ICD-10-CM | POA: Diagnosis not present

## 2021-01-15 DIAGNOSIS — Z6841 Body Mass Index (BMI) 40.0 and over, adult: Secondary | ICD-10-CM | POA: Diagnosis not present

## 2021-01-22 DIAGNOSIS — F4310 Post-traumatic stress disorder, unspecified: Secondary | ICD-10-CM | POA: Diagnosis not present

## 2021-02-03 DIAGNOSIS — F4310 Post-traumatic stress disorder, unspecified: Secondary | ICD-10-CM | POA: Diagnosis not present

## 2021-02-23 DIAGNOSIS — G4733 Obstructive sleep apnea (adult) (pediatric): Secondary | ICD-10-CM | POA: Diagnosis not present

## 2021-02-24 DIAGNOSIS — F4310 Post-traumatic stress disorder, unspecified: Secondary | ICD-10-CM | POA: Diagnosis not present

## 2021-03-10 DIAGNOSIS — G4733 Obstructive sleep apnea (adult) (pediatric): Secondary | ICD-10-CM | POA: Diagnosis not present

## 2021-03-17 DIAGNOSIS — F4310 Post-traumatic stress disorder, unspecified: Secondary | ICD-10-CM | POA: Diagnosis not present

## 2021-03-26 DIAGNOSIS — M2550 Pain in unspecified joint: Secondary | ICD-10-CM | POA: Diagnosis not present

## 2021-03-26 DIAGNOSIS — M79642 Pain in left hand: Secondary | ICD-10-CM | POA: Diagnosis not present

## 2021-03-26 DIAGNOSIS — R7303 Prediabetes: Secondary | ICD-10-CM | POA: Diagnosis not present

## 2021-03-26 DIAGNOSIS — I10 Essential (primary) hypertension: Secondary | ICD-10-CM | POA: Diagnosis not present

## 2021-03-26 DIAGNOSIS — M79672 Pain in left foot: Secondary | ICD-10-CM | POA: Diagnosis not present

## 2021-03-28 DIAGNOSIS — R21 Rash and other nonspecific skin eruption: Secondary | ICD-10-CM | POA: Diagnosis not present

## 2021-03-28 DIAGNOSIS — N760 Acute vaginitis: Secondary | ICD-10-CM | POA: Diagnosis not present

## 2021-03-28 DIAGNOSIS — Z01419 Encounter for gynecological examination (general) (routine) without abnormal findings: Secondary | ICD-10-CM | POA: Diagnosis not present

## 2021-03-28 DIAGNOSIS — L292 Pruritus vulvae: Secondary | ICD-10-CM | POA: Diagnosis not present

## 2021-04-07 DIAGNOSIS — F4310 Post-traumatic stress disorder, unspecified: Secondary | ICD-10-CM | POA: Diagnosis not present

## 2021-04-07 DIAGNOSIS — G4733 Obstructive sleep apnea (adult) (pediatric): Secondary | ICD-10-CM | POA: Diagnosis not present

## 2021-04-08 DIAGNOSIS — M79672 Pain in left foot: Secondary | ICD-10-CM | POA: Diagnosis not present

## 2022-06-08 ENCOUNTER — Other Ambulatory Visit: Payer: Self-pay | Admitting: Physician Assistant

## 2022-06-08 ENCOUNTER — Other Ambulatory Visit: Payer: Self-pay

## 2022-06-08 DIAGNOSIS — M459 Ankylosing spondylitis of unspecified sites in spine: Secondary | ICD-10-CM

## 2022-06-18 ENCOUNTER — Other Ambulatory Visit: Payer: Self-pay

## 2022-07-23 ENCOUNTER — Inpatient Hospital Stay: Admission: RE | Admit: 2022-07-23 | Payer: BC Managed Care – PPO | Source: Ambulatory Visit

## 2022-07-23 ENCOUNTER — Ambulatory Visit
Admission: RE | Admit: 2022-07-23 | Discharge: 2022-07-23 | Disposition: A | Discharge status: Home / Self Care | Payer: BC Managed Care – PPO | Source: Ambulatory Visit | Attending: Physician Assistant | Admitting: Physician Assistant

## 2022-07-23 DIAGNOSIS — M459 Ankylosing spondylitis of unspecified sites in spine: Secondary | ICD-10-CM
